# Patient Record
Sex: Female | Born: 1970 | Race: White | Hispanic: No | Marital: Married | Smoking: Never smoker
Health system: Southern US, Community
[De-identification: ages and names within clinical notes are randomized; demographics above are authoritative.]

## PROBLEM LIST (undated history)

## (undated) DIAGNOSIS — I10 Essential (primary) hypertension: Secondary | ICD-10-CM

## (undated) DIAGNOSIS — E7800 Pure hypercholesterolemia, unspecified: Secondary | ICD-10-CM

## (undated) DIAGNOSIS — F4010 Social phobia, unspecified: Secondary | ICD-10-CM

## (undated) DIAGNOSIS — E669 Obesity, unspecified: Secondary | ICD-10-CM

## (undated) HISTORY — PX: TONSILLECTOMY: SUR1361

---

## 2007-10-26 HISTORY — PX: GASTRIC BYPASS: SHX52

## 2016-02-10 ENCOUNTER — Emergency Department (HOSPITAL_BASED_OUTPATIENT_CLINIC_OR_DEPARTMENT_OTHER): Payer: Worker's Compensation

## 2016-02-10 ENCOUNTER — Emergency Department (HOSPITAL_BASED_OUTPATIENT_CLINIC_OR_DEPARTMENT_OTHER)
Admission: EM | Admit: 2016-02-10 | Discharge: 2016-02-10 | Disposition: A | Discharge status: Home / Self Care | Payer: Worker's Compensation | Attending: Emergency Medicine | Admitting: Emergency Medicine

## 2016-02-10 ENCOUNTER — Encounter (HOSPITAL_BASED_OUTPATIENT_CLINIC_OR_DEPARTMENT_OTHER): Payer: Self-pay | Admitting: *Deleted

## 2016-02-10 DIAGNOSIS — Y939 Activity, unspecified: Secondary | ICD-10-CM | POA: Insufficient documentation

## 2016-02-10 DIAGNOSIS — Y999 Unspecified external cause status: Secondary | ICD-10-CM | POA: Insufficient documentation

## 2016-02-10 DIAGNOSIS — I10 Essential (primary) hypertension: Secondary | ICD-10-CM | POA: Diagnosis not present

## 2016-02-10 DIAGNOSIS — Z79899 Other long term (current) drug therapy: Secondary | ICD-10-CM | POA: Insufficient documentation

## 2016-02-10 DIAGNOSIS — W19XXXA Unspecified fall, initial encounter: Secondary | ICD-10-CM

## 2016-02-10 DIAGNOSIS — M25562 Pain in left knee: Secondary | ICD-10-CM

## 2016-02-10 DIAGNOSIS — S82035A Nondisplaced transverse fracture of left patella, initial encounter for closed fracture: Secondary | ICD-10-CM | POA: Insufficient documentation

## 2016-02-10 DIAGNOSIS — M25561 Pain in right knee: Secondary | ICD-10-CM | POA: Diagnosis not present

## 2016-02-10 DIAGNOSIS — Y929 Unspecified place or not applicable: Secondary | ICD-10-CM | POA: Insufficient documentation

## 2016-02-10 DIAGNOSIS — W010XXA Fall on same level from slipping, tripping and stumbling without subsequent striking against object, initial encounter: Secondary | ICD-10-CM | POA: Insufficient documentation

## 2016-02-10 DIAGNOSIS — S8992XA Unspecified injury of left lower leg, initial encounter: Secondary | ICD-10-CM | POA: Diagnosis present

## 2016-02-10 HISTORY — DX: Social phobia, unspecified: F40.10

## 2016-02-10 HISTORY — DX: Pure hypercholesterolemia, unspecified: E78.00

## 2016-02-10 HISTORY — DX: Obesity, unspecified: E66.9

## 2016-02-10 HISTORY — DX: Essential (primary) hypertension: I10

## 2016-02-10 MED ORDER — IBUPROFEN 600 MG PO TABS: 600.0000 mg | ORAL_TABLET | Freq: Four times a day (QID) | ORAL | 0 refills | Status: AC | PRN

## 2016-02-10 MED ORDER — HYDROCODONE-ACETAMINOPHEN 5-325 MG PO TABS: 1.0000 | ORAL_TABLET | ORAL | 0 refills | Status: AC | PRN

## 2016-02-10 MED ORDER — IBUPROFEN 400 MG PO TABS
600.0000 mg | ORAL_TABLET | Freq: Once | ORAL | Status: AC
  Administered 2016-02-10: 600 mg via ORAL
  Filled 2016-02-10: qty 1

## 2016-02-10 NOTE — ED Triage Notes (Signed)
Pt fell on her knees today at work.  She reports pain in bilateral knees. She has an abrasion to right knee and most pain in left knee, she reports inability to move knee.  Pt was able to ambulate with assistance after this fall, she reports that it was very painful.  [facility]

## 2016-02-10 NOTE — ED Provider Notes (Signed)
[facility] Provider Note   CSN: [id number] Arrival date & time: 02/10/16  1103     History   Chief Complaint Chief Complaint  Patient presents with  . Knee Pain    HPI Diana Watkins is a 45 y.o. female.  Patient is a 45 year old female with history of hypertension who presents the ED with complaint of bilateral knee pain, onset 9:30 AM. Patient reports she was at work she tripped over a rug resulting in landing on both of her knees. Denies head injury or LOC. Patient reports having scrapes to both of her knees with associated pain. She notes pain is worse in her left knee and endorses associated swelling. Patient states she is able to bend her right knee but unable to bend her left knee due to associated pain. Denies taking any pain prior to arrival. Denies prior injuries to either knee. Denies bleeding, redness, numbness, tingling, weakness.      Past Medical History:  Diagnosis Date  . High cholesterol   . Hypertension   . Obesity   . Social anxiety disorder     There are no active problems to display for this patient.   Past Surgical History:  Procedure Laterality Date  . CESAREAN SECTION    . GASTRIC BYPASS  10/2007  . TONSILLECTOMY      OB History    No data available       Home Medications    Prior to Admission medications   Medication Sig Start Date End Date Taking? Authorizing Provider  hydrochlorothiazide (HYDRODIURIL) 12.5 MG tablet Take 12.5 mg by mouth daily.   Yes Historical Provider, MD  rosuvastatin (CRESTOR) 20 MG tablet Take 20 mg by mouth daily.   Yes Historical Provider, MD  sertraline (ZOLOFT) 100 MG tablet Take 150 mg by mouth daily.   Yes Historical Provider, MD  HYDROcodone-acetaminophen (NORCO/VICODIN) 5-325 MG tablet Take 1 tablet by mouth every 4 (four) hours as needed. 02/10/16   Barrett Henle, PA-C  ibuprofen (ADVIL,MOTRIN) 600 MG tablet Take 1 tablet (600 mg total) by mouth every 6 (six) hours as  needed. 02/10/16   Barrett Henle, PA-C    Family History No family history on file.  Social History Social History  Substance Use Topics  . Smoking status: Never Smoker  . Smokeless tobacco: Never Used  . Alcohol use No     Allergies   Zocor [simvastatin]   Review of Systems Review of Systems  Constitutional: Negative for fever.  Musculoskeletal: Positive for arthralgias (bilateral knee pain) and joint swelling.  Skin: Positive for wound (abrasion).  Neurological: Negative for weakness and numbness.     Physical Exam Updated Vital Signs BP 135/93 (BP Location: Right Arm)   Pulse 68   Temp 98.3 F (36.8 C) (Oral)   Resp 18   Ht 5\' 2"  (1.575 m)   Wt 120.2 kg   SpO2 99%   BMI 48.47 kg/m   Physical Exam  Constitutional: She is oriented to person, place, and time. She appears well-developed and well-nourished. No distress.  HENT:  Head: Normocephalic and atraumatic.  Eyes: Conjunctivae and EOM are normal. Right eye exhibits no discharge. Left eye exhibits no discharge. No scleral icterus.  Neck: Normal range of motion. Neck supple.  Cardiovascular: Normal rate and intact distal pulses.   Pulmonary/Chest: Effort normal. No respiratory distress.  Musculoskeletal: She exhibits tenderness. She exhibits no edema.       Right knee: She exhibits normal range of motion,  no swelling, no effusion, no ecchymosis, no deformity, no laceration, no erythema, normal alignment, no LCL laxity, normal patellar mobility and no MCL laxity. Tenderness (mild tenderness over tibial tuberosity) found.       Left knee: She exhibits decreased range of motion (due to reported pain) and swelling. She exhibits no effusion, no ecchymosis, no laceration, no erythema and normal alignment. Tenderness (diffuse tenderness over anterior knee) found.  Decreased range of motion with full flexion or extension of left knee due to reported pain however patient able to actively extend left knee; knee  exam limited due to tenderness reported by patient, unable to assess ligaments. Full range of motion of right knee with 5 out of 5 strength. Diffuse tenderness over left anterior knee, worse over the patella. No tenderness over her left femur, tib-fib. Full range of motion of bilateral ankles and feet. Abrasions noted to bilateral anterior knees, no active bleeding. Sensation grossly intact. Cap refill less than 2. 2+ DP pulses.  Neurological: She is alert and oriented to person, place, and time.  Skin: Skin is warm and dry. Capillary refill takes less than 2 seconds. She is not diaphoretic.  Nursing note and vitals reviewed.    ED Treatments / Results  Labs (all labs ordered are listed, but only abnormal results are displayed) Labs Reviewed - No data to display  EKG  EKG Interpretation None       Radiology Dg Knee Complete 4 Views Left  Result Date: 02/10/2016 CLINICAL DATA:  Tripped over rug today at work, fell onto knees, BILATERAL anterior knee pain with abrasions and swelling, LEFT greater than RIGHT, initial encounter EXAM: LEFT KNEE - COMPLETE 4+ VIEW COMPARISON:  None FINDINGS: Minimal medial compartment joint space narrowing. Question mild osseous demineralization. Transverse nondisplaced patellar fracture. Associated anterior soft tissue swelling. No additional fracture or dislocation. Prominent tibial tuberosity. IMPRESSION: Nondisplaced transverse LEFT patellar fracture. Electronically Signed   By: Ulyses SouthwardMark  Boles M.D.   On: 02/10/2016 11:50   Dg Knee Complete 4 Views Right  Result Date: 02/10/2016 CLINICAL DATA:  Tripped over a rug at work today and fell onto knees, BILATERAL anterior knee pain LEFT greater than RIGHT, initial encounter EXAM: RIGHT KNEE - COMPLETE 4+ VIEW COMPARISON:  None FINDINGS: Osseous mineralization grossly normal for technique. Mild patellofemoral joint space narrowing and tiny marginal spurs. No acute fracture, dislocation, or bone destruction. No knee  joint effusion. Clothing artifacts at distal thigh. IMPRESSION: Mild patellofemoral degenerative changes. No acute bony abnormalities. Electronically Signed   By: Ulyses SouthwardMark  Boles M.D.   On: 02/10/2016 11:51    Procedures Procedures (including critical care time)  Medications Ordered in ED Medications  ibuprofen (ADVIL,MOTRIN) tablet 600 mg (not administered)     Initial Impression / Assessment and Plan / ED Course  I have reviewed the triage vital signs and the nursing notes.  Pertinent labs & imaging results that were available during my care of the patient were reviewed by me and considered in my medical decision making (see chart for details).  Clinical Course    Patient presents with bilateral knee pain, left worse than right after falling due to tripping over a rug at work. Denies head injury or LOC. VSS. Exam revealed tenderness over bilateral knees, left worse than right with decreased range of motion of left knee due to reported pain and swelling. Patient unable to bear weight on left knee due to reported pain. Bilateral lower extremities neurovascularly intact. Right knee x-ray unremarkable. Left knee x-ray revealed  nondisplaced transverse left patellar fracture. A shunt placed and left knee immobilizer and given crutches in the ED. Discussed results and plan for discharge with patient. Patient given information to follow-up with orthopedics for further management. Patient discharged home with pain meds, NSAIDs and discussed symptomatic treatment. Discussed return precautions.  Final Clinical Impressions(s) / ED Diagnoses   Final diagnoses:  Fall, initial encounter  Acute pain of both knees  Closed nondisplaced transverse fracture of left patella, initial encounter    New Prescriptions New Prescriptions   HYDROCODONE-ACETAMINOPHEN (NORCO/VICODIN) 5-325 MG TABLET    Take 1 tablet by mouth every 4 (four) hours as needed.   IBUPROFEN (ADVIL,MOTRIN) 600 MG TABLET    Take 1 tablet  (600 mg total) by mouth every 6 (six) hours as needed.     Satira Sark [place], [place] 02/10/16 1215    Tilden Fossa, MD 02/11/16 [phone]

## 2016-02-10 NOTE — Discharge Instructions (Signed)
Take your medications as prescribed as needed for pain relief. Continue to wear your knee immobilizer and remain nonweightbearing on your left knee until follow-up with the orthopedic clinic listed above. I also recommend resting, elevating and applying ice to bilateral knees to help with pain and swelling. Call the orthopedic office listed above to schedule a follow-up appointment within the next week. Please return to the Emergency Department if symptoms worsen or new onset of fever, redness, swelling, warmth, numbness, tingling, weakness.

## 2016-09-10 ENCOUNTER — Other Ambulatory Visit (HOSPITAL_COMMUNITY): Payer: Self-pay | Admitting: Orthopedic Surgery

## 2016-09-10 DIAGNOSIS — R609 Edema, unspecified: Secondary | ICD-10-CM

## 2016-09-10 DIAGNOSIS — R52 Pain, unspecified: Secondary | ICD-10-CM

## 2016-09-11 ENCOUNTER — Ambulatory Visit (HOSPITAL_COMMUNITY)
Admission: RE | Admit: 2016-09-11 | Discharge: 2016-09-11 | Disposition: A | Discharge status: Home / Self Care | Payer: Managed Care, Other (non HMO) | Source: Ambulatory Visit | Attending: Internal Medicine | Admitting: Internal Medicine

## 2016-09-11 DIAGNOSIS — R52 Pain, unspecified: Secondary | ICD-10-CM

## 2016-09-11 DIAGNOSIS — R609 Edema, unspecified: Secondary | ICD-10-CM | POA: Diagnosis not present

## 2018-09-12 IMAGING — CR DG KNEE COMPLETE 4+V*R*
4 series · 4 of 4 positions shown · non-contrast
Comparison: None

CLINICAL DATA: Tripped over a rug at work today and fell onto
knees, BILATERAL anterior knee pain LEFT greater than RIGHT, initial
encounter

EXAM:
RIGHT KNEE - COMPLETE 4+ VIEW

[t knee ap right]
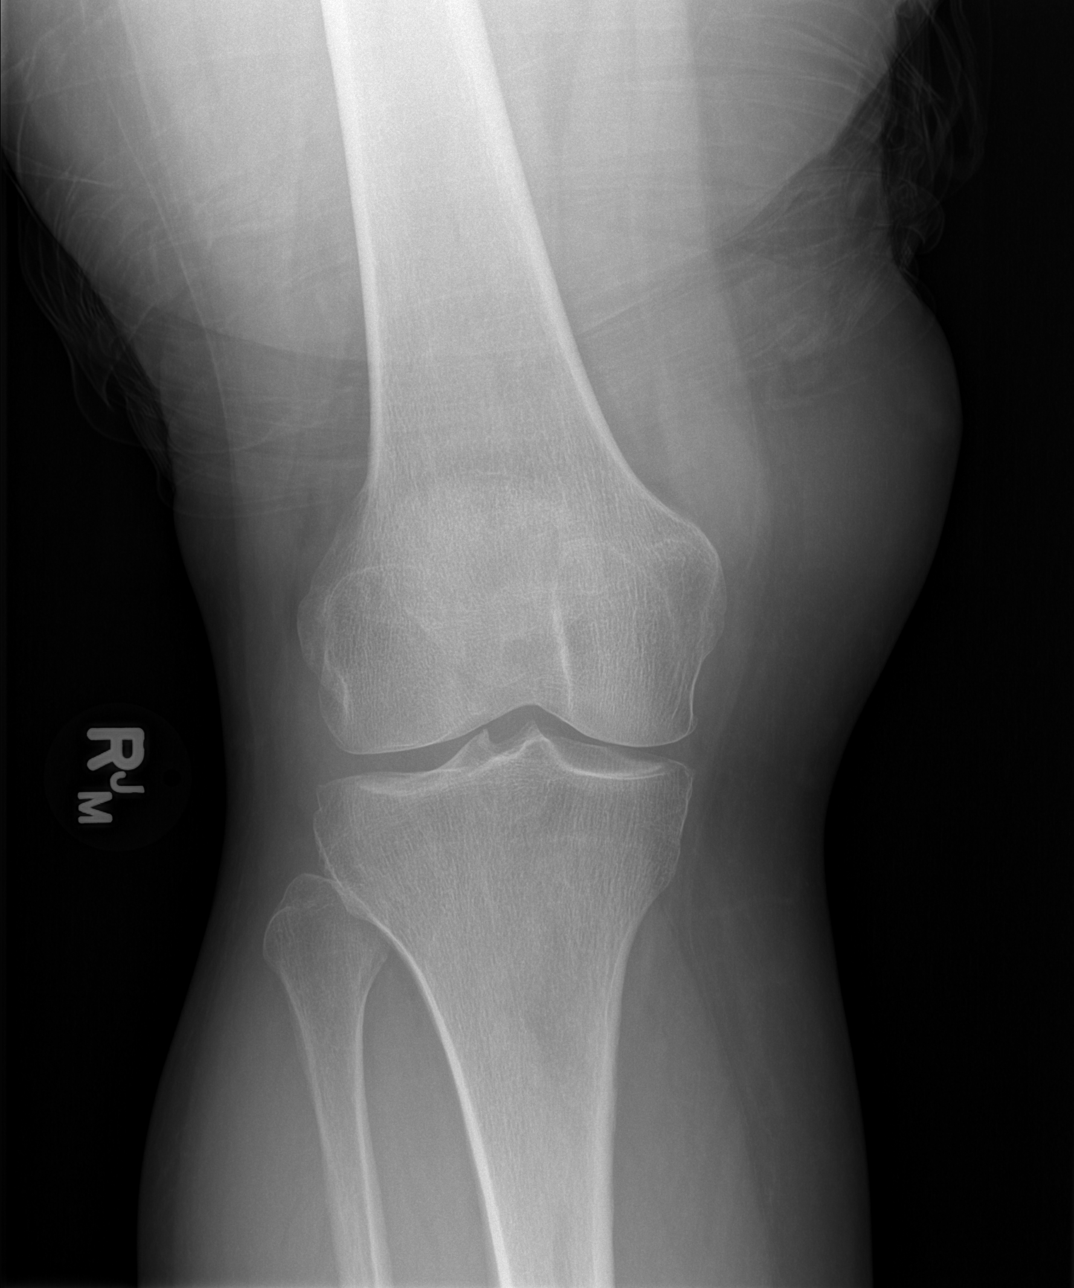

[t knee oblique right (1 of 2)]
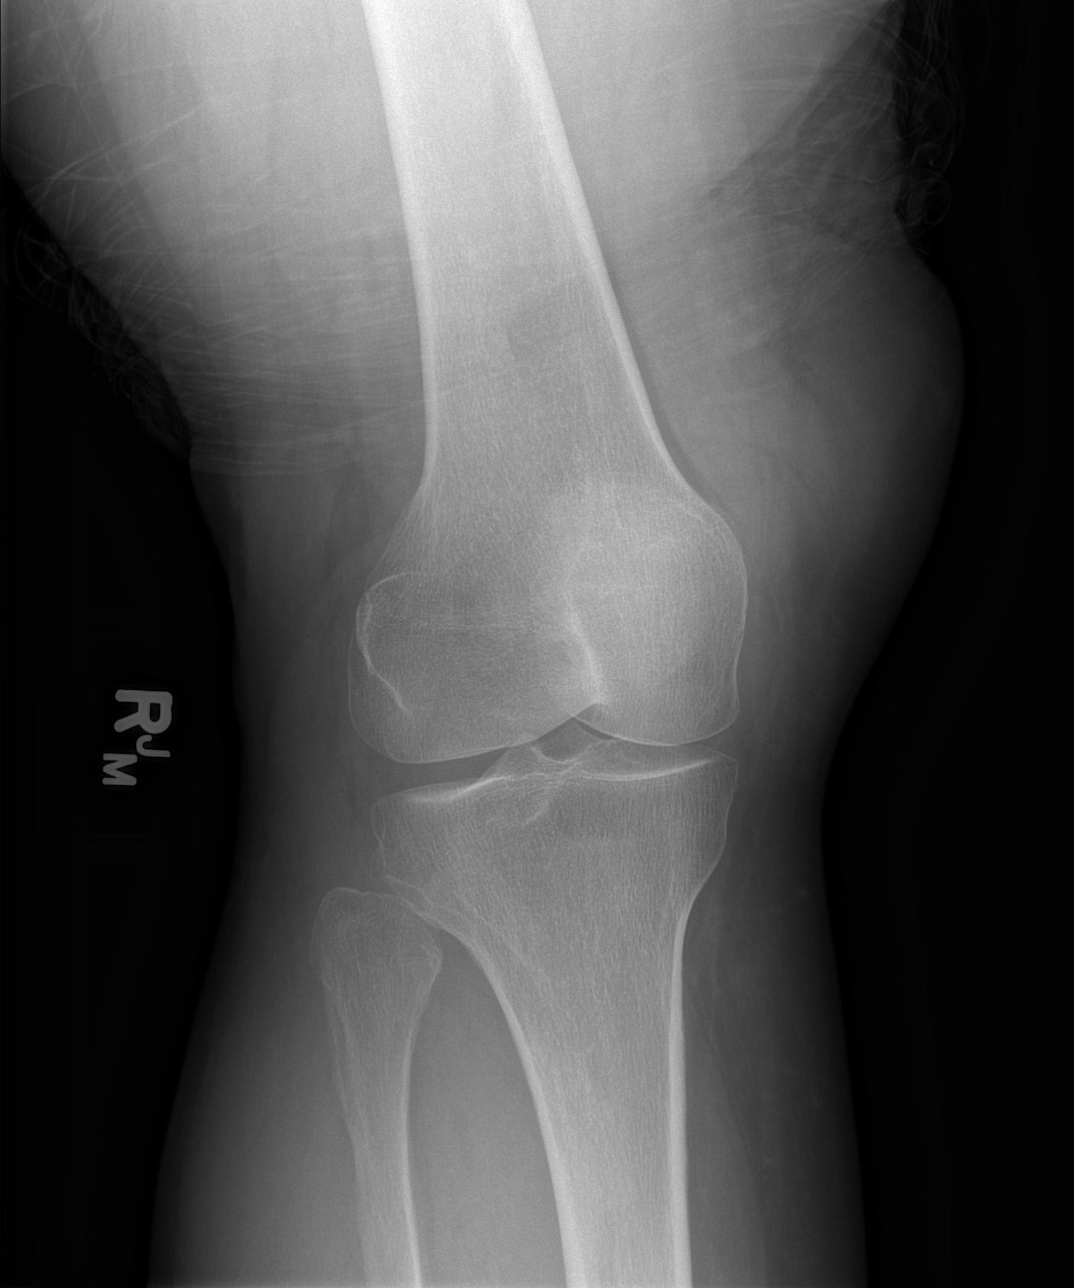

[t knee oblique right (2 of 2)]
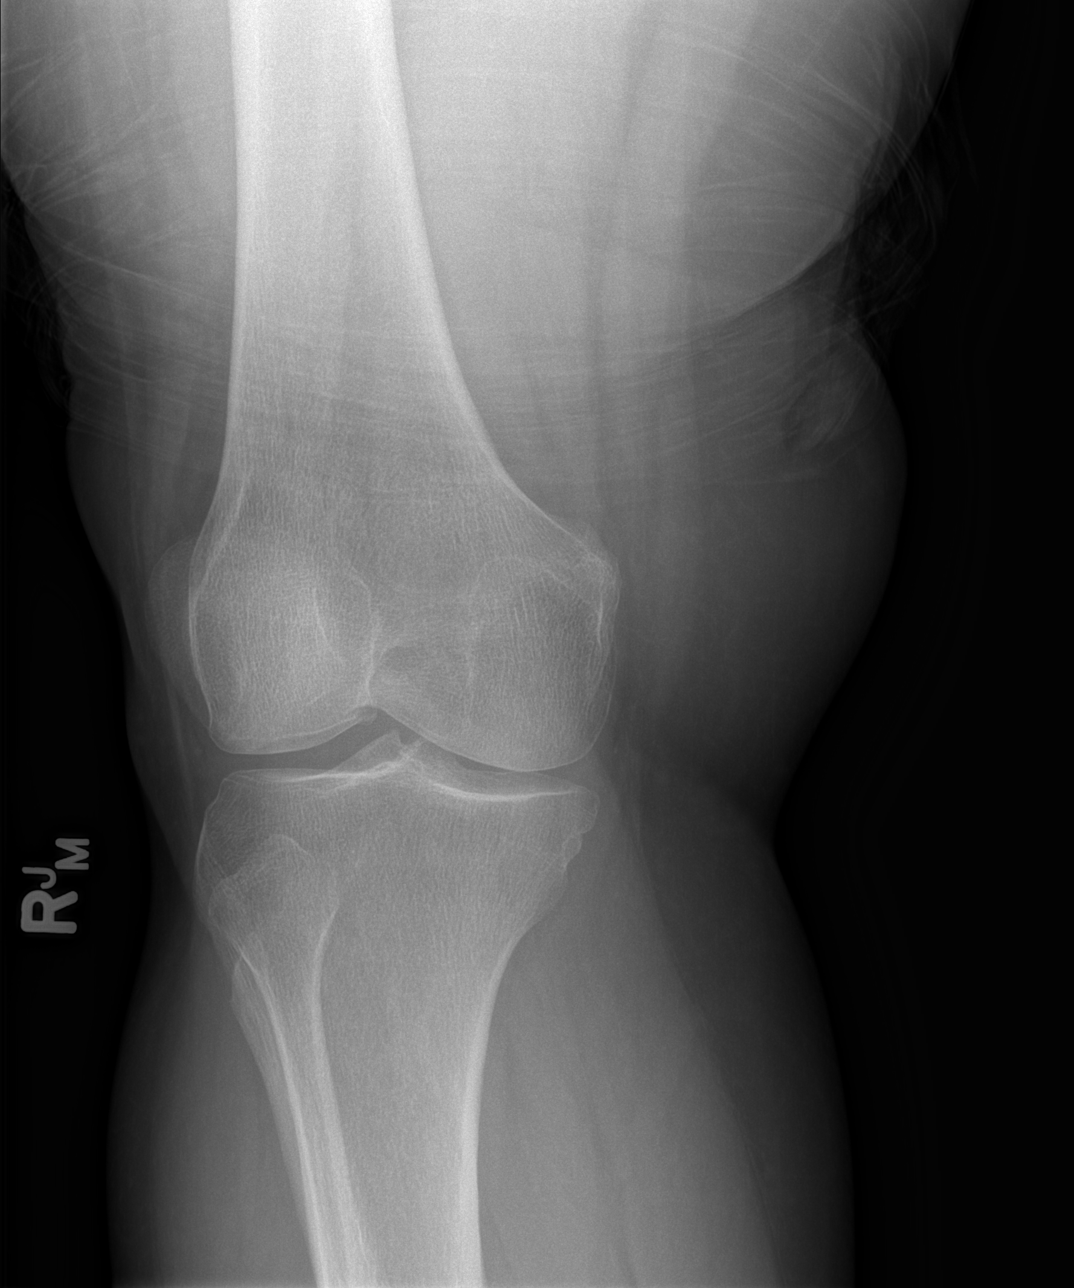

[t knee lat right]
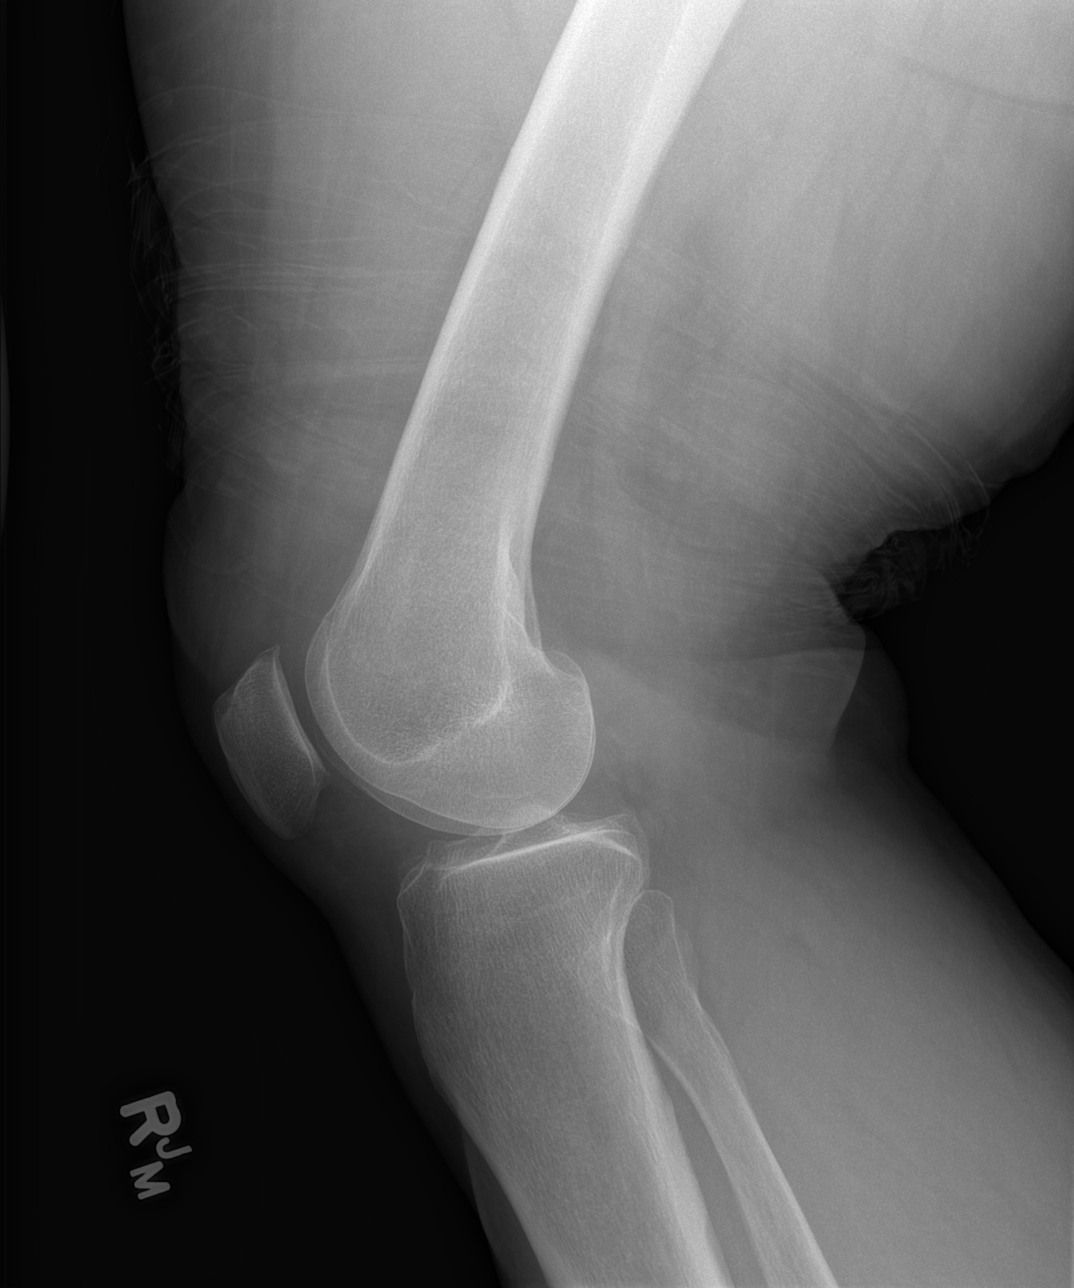

[4 of 4 positions shown; findings below may reference images not displayed]

FINDINGS: Osseous mineralization grossly normal for technique.

Mild patellofemoral joint space narrowing and tiny marginal spurs.

No acute fracture, dislocation, or bone destruction.

No knee joint effusion.

Clothing artifacts at distal thigh.
IMPRESSION: Mild patellofemoral degenerative changes.

No acute bony abnormalities.
